# Patient Record
Sex: Female | Born: 1965 | Race: Black or African American | Hispanic: No | Marital: Married | Smoking: Never smoker
Health system: Southern US, Community
[De-identification: ages and names within clinical notes are randomized; demographics above are authoritative.]

## PROBLEM LIST (undated history)

## (undated) ENCOUNTER — Emergency Department (HOSPITAL_BASED_OUTPATIENT_CLINIC_OR_DEPARTMENT_OTHER): Payer: BC Managed Care – PPO

## (undated) HISTORY — PX: TUBAL LIGATION: SHX77

---

## 2010-11-16 ENCOUNTER — Encounter: Admission: RE | Admit: 2010-11-16 | Discharge: 2010-11-16 | Payer: Self-pay | Admitting: Gynecology

## 2011-12-08 ENCOUNTER — Other Ambulatory Visit: Payer: Self-pay | Admitting: Gynecology

## 2011-12-08 DIAGNOSIS — Z1231 Encounter for screening mammogram for malignant neoplasm of breast: Secondary | ICD-10-CM

## 2012-01-16 ENCOUNTER — Ambulatory Visit: Payer: Self-pay

## 2012-01-17 ENCOUNTER — Ambulatory Visit
Admission: RE | Admit: 2012-01-17 | Discharge: 2012-01-17 | Disposition: A | Discharge status: Home / Self Care | Payer: BC Managed Care – PPO | Source: Ambulatory Visit | Attending: Gynecology | Admitting: Gynecology

## 2012-01-17 DIAGNOSIS — Z1231 Encounter for screening mammogram for malignant neoplasm of breast: Secondary | ICD-10-CM

## 2014-12-24 ENCOUNTER — Emergency Department (HOSPITAL_BASED_OUTPATIENT_CLINIC_OR_DEPARTMENT_OTHER): Payer: BC Managed Care – PPO

## 2014-12-24 ENCOUNTER — Emergency Department (HOSPITAL_BASED_OUTPATIENT_CLINIC_OR_DEPARTMENT_OTHER)
Admission: EM | Admit: 2014-12-24 | Discharge: 2014-12-24 | Disposition: A | Discharge status: Home / Self Care | Payer: BC Managed Care – PPO | Attending: Emergency Medicine | Admitting: Emergency Medicine

## 2014-12-24 ENCOUNTER — Encounter (HOSPITAL_BASED_OUTPATIENT_CLINIC_OR_DEPARTMENT_OTHER): Payer: Self-pay | Admitting: Family Medicine

## 2014-12-24 DIAGNOSIS — J029 Acute pharyngitis, unspecified: Secondary | ICD-10-CM | POA: Insufficient documentation

## 2014-12-24 DIAGNOSIS — R05 Cough: Secondary | ICD-10-CM | POA: Insufficient documentation

## 2014-12-24 DIAGNOSIS — R059 Cough, unspecified: Secondary | ICD-10-CM

## 2014-12-24 MED ORDER — HYDROCODONE-HOMATROPINE 5-1.5 MG/5ML PO SYRP: 5.0000 mL | ORAL_SOLUTION | Freq: Four times a day (QID) | ORAL | Status: AC | PRN

## 2014-12-24 MED ORDER — PREDNISONE 50 MG PO TABS
60.0000 mg | ORAL_TABLET | ORAL | Status: AC
  Administered 2014-12-24: 60 mg via ORAL
  Filled 2014-12-24 (×2): qty 1

## 2014-12-24 MED ORDER — ALBUTEROL SULFATE HFA 108 (90 BASE) MCG/ACT IN AERS: 1.0000 | INHALATION_SPRAY | Freq: Four times a day (QID) | RESPIRATORY_TRACT | Status: AC | PRN

## 2014-12-24 MED ORDER — PREDNISONE 20 MG PO TABS: 40.0000 mg | ORAL_TABLET | Freq: Every day | ORAL | Status: AC

## 2014-12-24 NOTE — ED Notes (Signed)
Pt c/o cough, nasal drainage and body aches x 1 wk. Denies fever, n/v/d.

## 2014-12-24 NOTE — ED Provider Notes (Signed)
CSN: [id number]     Arrival date & time 12/24/14  1819 History  This chart was scribed for Gerhard Munch, MD by Roxy Cedar, ED Scribe. This patient was seen in room MH05/MH05 and the patient's care was started at 7:07 PM.   Chief Complaint  Patient presents with  . Cough   Patient is a 49 y.o. female presenting with cough. The history is provided by the patient. No language interpreter was used.  Cough Associated symptoms: sore throat     HPI Comments: Melissa Park is a 49 y.o. female who presents to the Emergency Department complaining of persistent dry cough, sore throat, drainage and body aches that began 2 weeks ago, which worsened 2 days ago. She denies associated abdominal pain, rash, extremity pain, nausea, emesis. She states she took nyquil, dayquil and eating applesauce with no relief. She states that she had a sick contact with similar symptoms over the holidays. She states that her symptoms worsen during the night.  History reviewed. No pertinent past medical history. Past Surgical History  Procedure Laterality Date  . Tubal ligation     No family history on file. History  Substance Use Topics  . Smoking status: Never Smoker   . Smokeless tobacco: Not on file  . Alcohol Use: Yes   OB History    No data available     Review of Systems  Constitutional:       Per HPI, otherwise negative  HENT: Positive for postnasal drip and sore throat.        Per HPI, otherwise negative  Respiratory: Positive for cough.        Per HPI, otherwise negative  Cardiovascular:       Per HPI, otherwise negative  Gastrointestinal: Negative for nausea, vomiting, abdominal pain and diarrhea.  Endocrine:       Negative aside from HPI  Genitourinary:       Neg aside from HPI   Musculoskeletal:       Per HPI, otherwise negative  Skin: Negative.   Neurological: Negative for syncope.   Allergies  Review of patient's allergies indicates no known allergies.  Home Medications    Prior to Admission medications   Not on File   Triage Vitals: BP 131/73 mmHg  Pulse 72  Temp(Src) 98.8 F (37.1 C) (Oral)  Resp 16  Ht  (1.676 m)  Wt 270 lb (122.471 kg)  BMI 43.60 kg/m2  SpO2 100%  LMP 12/21/2014  Physical Exam  Constitutional: She is oriented to person, place, and time. She appears well-developed and well-nourished. No distress.  HENT:  Head: Normocephalic and atraumatic.  Mouth/Throat: Oropharynx is clear and moist.  Eyes: Conjunctivae and EOM are normal.  Cardiovascular: Normal rate and regular rhythm.   Pulmonary/Chest: Effort normal and breath sounds normal. No stridor. No respiratory distress.  Abdominal: She exhibits no distension.  Musculoskeletal: She exhibits no edema.  Neurological: She is alert and oriented to person, place, and time. No cranial nerve deficit.  Skin: Skin is warm and dry.  Psychiatric: She has a normal mood and affect.  Nursing note and vitals reviewed.  ED Course  Procedures (including critical care time)  DIAGNOSTIC STUDIES: Oxygen Saturation is 100% on RA, normal by my interpretation.    COORDINATION OF CARE: 7:14 PM- Discussed plans to order diagnostic CXR. Pt advised of plan for treatment and pt agrees.   Imaging Review Dg Chest 2 View  12/24/2014   CLINICAL DATA:  Persistent dry cough since Christmas.  More productive cough with body aches a sore throat over the past week. Nausea vomiting and diarrhea.  EXAM: CHEST  2 VIEW  COMPARISON:  None.  FINDINGS: Lungs are hypoinflated but otherwise clear. Cardiomediastinal silhouette is within normal. Remaining bones and soft tissues are unremarkable.  IMPRESSION: No active cardiopulmonary disease.   Electronically Signed   By: Elberta Fortisaniel  Boyle M.D.   On: 12/24/2014 19:09   On repeat exam the patient appears calm.  She voiced an understanding of all results, follow-up instructions, return precautions.  MDM   Final diagnoses:  Cough  This patient p/w ongoing cough,  fatigue. No e/o PNA or sepsis / bacteremia.  No distress. Patient was HD stable, awake and alert throughout the ED course. She was d/c in stable condition.     I personally performed the services described in this documentation, which was scribed in my presence. The recorded information has been reviewed and is accurate.  Gerhard Munchobert Emogene Muratalla, MD 12/24/14 1940

## 2014-12-24 NOTE — Discharge Instructions (Signed)
As discussed, your evaluation today has been largely reassuring.  But, it is important that you monitor your condition carefully, and do not hesitate to return to the ED if you develop new, or concerning changes in your condition. ? ?Otherwise, please follow-up with your physician for appropriate ongoing care. ? ?

## 2016-03-12 IMAGING — CR DG CHEST 2V
2 series · 2 of 2 positions shown · non-contrast
Comparison: None.

CLINICAL DATA: Persistent dry cough since [REDACTED]. More
productive cough with body aches a sore throat over the past week.
Nausea vomiting and diarrhea.

EXAM:
CHEST  2 VIEW

[w chest pa]
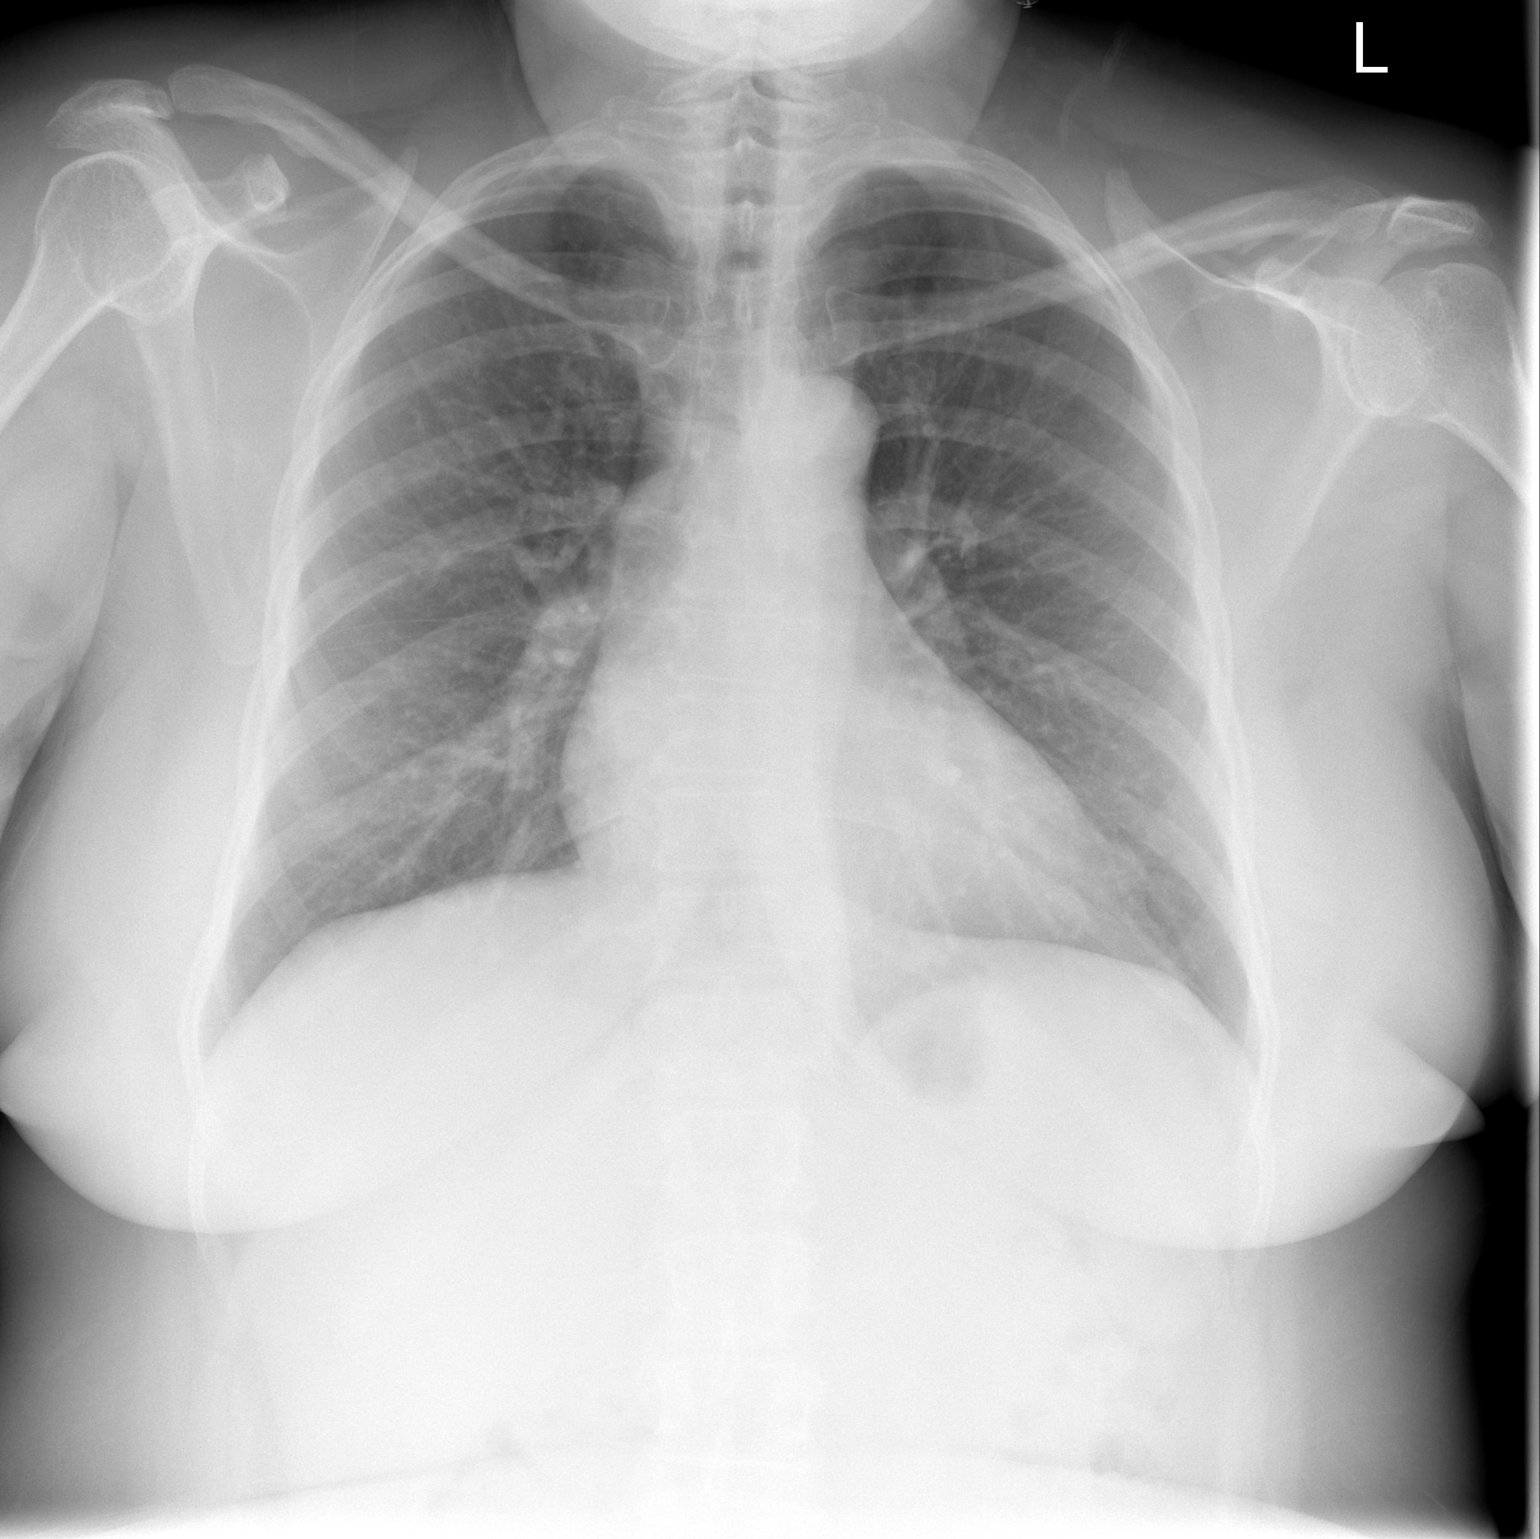

[w chest lat]
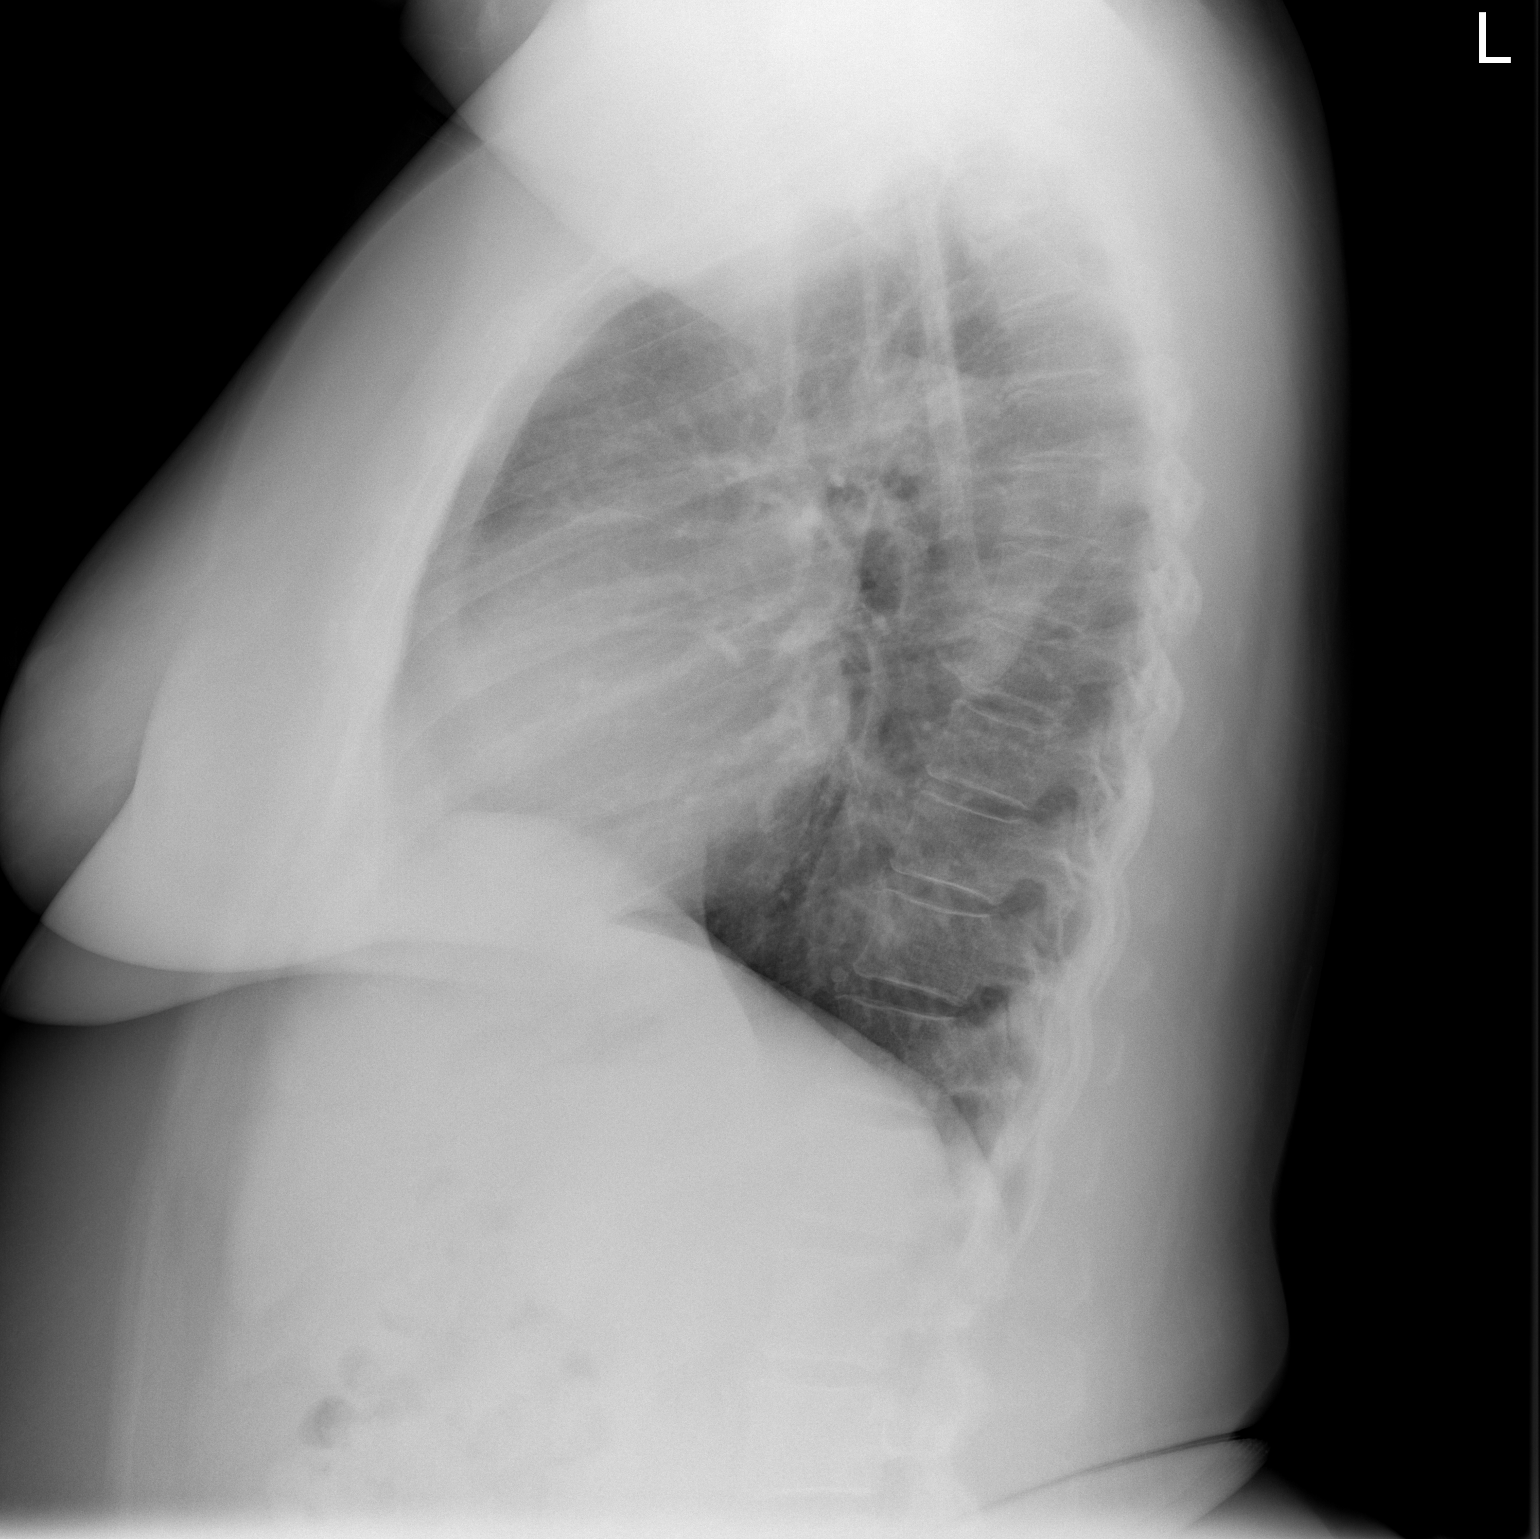

[2 of 2 positions shown; findings below may reference images not displayed]

FINDINGS: Lungs are hypoinflated but otherwise clear. Cardiomediastinal
silhouette is within normal. Remaining bones and soft tissues are
unremarkable.
IMPRESSION: No active cardiopulmonary disease.

## 2022-10-13 ENCOUNTER — Encounter (HOSPITAL_BASED_OUTPATIENT_CLINIC_OR_DEPARTMENT_OTHER): Payer: Self-pay | Admitting: *Deleted

## 2022-10-13 ENCOUNTER — Other Ambulatory Visit: Payer: Self-pay

## 2022-10-13 ENCOUNTER — Emergency Department (HOSPITAL_BASED_OUTPATIENT_CLINIC_OR_DEPARTMENT_OTHER)
Admission: EM | Admit: 2022-10-13 | Discharge: 2022-10-13 | Disposition: A | Discharge status: Home / Self Care | Payer: BC Managed Care – PPO | Attending: Emergency Medicine | Admitting: Emergency Medicine

## 2022-10-13 DIAGNOSIS — R0981 Nasal congestion: Secondary | ICD-10-CM | POA: Diagnosis present

## 2022-10-13 DIAGNOSIS — J069 Acute upper respiratory infection, unspecified: Secondary | ICD-10-CM | POA: Insufficient documentation

## 2022-10-13 MED ORDER — BENZONATATE 100 MG PO CAPS: 100.0000 mg | ORAL_CAPSULE | Freq: Three times a day (TID) | ORAL | 0 refills | Status: AC

## 2022-10-13 NOTE — ED Triage Notes (Signed)
Patient reports she has had congestion for the past few days.  She denies any fevers.  She denies a cough.  Patient is alert.  She reports she has taken an covid test with negative results.

## 2022-10-13 NOTE — ED Notes (Signed)
Patient unable to sign form

## 2022-10-13 NOTE — ED Provider Notes (Signed)
[facility] EMERGENCY DEPARTMENT Provider Note   CSN: [id number] Arrival date & time: 10/13/22  1207     History  Chief Complaint  Patient presents with   Nasal Congestion    Melissa Park is a 56 y.o. female.Patient presents to the emergency department complaining of nasal congestion and cough for 3 days. She denies fevers, sore throat, headaches, body aches, chills. She works as a Pharmacist, hospital and states that she has taken multiple covid tests which were negative.  The patient has no relevant past medical history on file  HPI     Home Medications Prior to Admission medications   Medication Sig Start Date End Date Taking? Authorizing Provider  benzonatate (TESSALON) 100 MG capsule Take 1 capsule (100 mg total) by mouth every 8 (eight) hours. 10/13/22  Yes Cherlynn June B, PA-C  albuterol (PROVENTIL HFA;VENTOLIN HFA) 108 (90 BASE) MCG/ACT inhaler Inhale 1-2 puffs into the lungs every 6 (six) hours as needed for wheezing or shortness of breath. 12/24/14   Carmin Muskrat, MD  HYDROcodone-homatropine [facility]) 5-1.5 MG/5ML syrup Take 5 mLs by mouth every 6 (six) hours as needed for cough. 12/24/14   Carmin Muskrat, MD      Allergies    Patient has no known allergies.    Review of Systems   Review of Systems  Constitutional:  Negative for chills and fever.  HENT:  Positive for congestion. Negative for sore throat.   Respiratory:  Positive for cough. Negative for shortness of breath.   Cardiovascular:  Negative for chest pain.    Physical Exam Updated Vital Signs BP 136/89 (BP Location: Right Arm)   Pulse 65   Temp 98.3 F (36.8 C) (Oral)   Resp 20   Ht 5\' 5"  (1.651 m)   Wt 117.9 kg   SpO2 100%   BMI 43.27 kg/m  Physical Exam Vitals and nursing note reviewed.  Constitutional:      General: She is not in acute distress.    Appearance: She is well-developed.  HENT:     Head: Normocephalic and atraumatic.     Nose: Nose normal. No congestion or rhinorrhea.      Mouth/Throat:     Mouth: Mucous membranes are moist.     Pharynx: No posterior oropharyngeal erythema.  Eyes:     Conjunctiva/sclera: Conjunctivae normal.  Cardiovascular:     Rate and Rhythm: Normal rate and regular rhythm.     Heart sounds: No murmur heard. Pulmonary:     Effort: Pulmonary effort is normal. No respiratory distress.     Breath sounds: Normal breath sounds.  Abdominal:     Palpations: Abdomen is soft.     Tenderness: There is no abdominal tenderness.  Musculoskeletal:        General: No swelling.     Cervical back: Neck supple.  Skin:    General: Skin is warm and dry.     Capillary Refill: Capillary refill takes less than 2 seconds.  Neurological:     Mental Status: She is alert.  Psychiatric:        Mood and Affect: Mood normal.     ED Results / Procedures / Treatments   Labs (all labs ordered are listed, but only abnormal results are displayed) Labs Reviewed - No data to display  EKG None  Radiology No results found.  Procedures Procedures    Medications Ordered in ED Medications - No data to display  ED Course/ Medical Decision Making/ A&P  Medical Decision Making Risk Prescription drug management.   Patient presents with chief complaint of nasal congestion and cough.  Differential diagnosis includes but is not limited to upper respiratory infection, allergies, COVID-19, influenza, and others  I reviewed the patient's past medical history and found no relevant visits.  The patient has mild symptoms and a negative COVID-19 test at work.  I see no indication for further lab test.  The patient's lungs are clear to auscultation bilaterally.  I see no indication at this time for chest x-ray  The patient appears to have an upper respiratory infection versus allergies.  She has very minimal congestion at this time.  Nasal passages appear clear.  No rhinorrhea noted.  Lungs are clear to auscultation bilaterally.   Cough is reportedly nonproductive at this time.  Plan to discharge patient home with Jerilynn Som for symptom management.  I also recommend the patient try cetirizine for allergy coverage.  I explained that there is no indication at this time for antibiotics and the patient understands.  Patient to follow-up as needed with primary care provider        Final Clinical Impression(s) / ED Diagnoses Final diagnoses:  Upper respiratory tract infection, unspecified type    Rx / DC Orders ED Discharge Orders          Ordered    benzonatate (TESSALON) 100 MG capsule  Every 8 hours        10/13/22 1706              Darrick Grinder, PA-C 10/13/22 1718    Elayne Snare K, DO 10/13/22 2303

## 2022-10-13 NOTE — Discharge Instructions (Signed)
You were diagnosed today with an upper respiratory infection.  You may also have some seasonal allergies.  Please take the prescribed cough medicine as directed.  You may also add Zyrtec (cetirizine) for allergy coverage.  If you develop shortness of breath or other life-threatening symptoms please return to the emergency department.
